# Patient Record
Sex: Male | Born: 1975 | Race: White | Hispanic: No | Marital: Single | Smoking: Never smoker
Health system: Southern US, Community
[De-identification: ages and names within clinical notes are randomized; demographics above are authoritative.]

---

## 2006-04-21 ENCOUNTER — Emergency Department: Payer: Self-pay | Admitting: Emergency Medicine

## 2017-11-27 DIAGNOSIS — R7989 Other specified abnormal findings of blood chemistry: Secondary | ICD-10-CM | POA: Insufficient documentation

## 2019-06-17 ENCOUNTER — Other Ambulatory Visit: Payer: Self-pay

## 2019-06-17 DIAGNOSIS — Z20822 Contact with and (suspected) exposure to covid-19: Secondary | ICD-10-CM

## 2019-06-22 LAB — NOVEL CORONAVIRUS, NAA: SARS-CoV-2, NAA: NOT DETECTED

## 2019-06-24 ENCOUNTER — Other Ambulatory Visit: Payer: Self-pay | Admitting: Internal Medicine

## 2019-06-24 DIAGNOSIS — Z20822 Contact with and (suspected) exposure to covid-19: Secondary | ICD-10-CM

## 2019-06-28 LAB — NOVEL CORONAVIRUS, NAA: SARS-CoV-2, NAA: DETECTED — AB

## 2019-06-30 ENCOUNTER — Telehealth: Payer: Self-pay

## 2019-06-30 NOTE — Telephone Encounter (Signed)
Outgoing call to Patient. Inquired if Patient received Covid-19 testing results.  Patient states that he received results.  Provided care advice.  Encouraged to call with questions if needed.  Patient voiced understanding.

## 2020-05-29 ENCOUNTER — Ambulatory Visit: Payer: Self-pay | Admitting: Family Medicine

## 2020-05-29 ENCOUNTER — Encounter: Payer: Self-pay | Admitting: Family Medicine

## 2020-05-29 ENCOUNTER — Other Ambulatory Visit: Payer: Self-pay

## 2020-05-29 DIAGNOSIS — Z113 Encounter for screening for infections with a predominantly sexual mode of transmission: Secondary | ICD-10-CM

## 2020-05-29 DIAGNOSIS — N453 Epididymo-orchitis: Secondary | ICD-10-CM

## 2020-05-29 LAB — GRAM STAIN

## 2020-05-29 MED ORDER — DOXYCYCLINE HYCLATE 100 MG PO TABS: 100.0000 mg | ORAL_TABLET | Freq: Two times a day (BID) | ORAL | 0 refills | Status: AC

## 2020-05-29 MED ORDER — CEFTRIAXONE SODIUM 500 MG IJ SOLR
500.0000 mg | Freq: Once | INTRAMUSCULAR | Status: AC
  Administered 2020-05-29: 500 mg via INTRAMUSCULAR

## 2020-05-29 NOTE — Progress Notes (Signed)
[facility] Department STI clinic/screening visit  Subjective:  Douglas Terrell is a 44 y.o. male being seen today for  Chief Complaint  Patient presents with  . Exposure to STD     The patient reports they do have symptoms.   Patient has the following medical conditions:   Patient Active Problem List   Diagnosis Date Noted  . 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 Denies fever, discharge from penis or dysuria. Denies injury, bike riding, etc. States a condom broke recently, would like STI screening.   Pt last urinated 2 hrs ago.  See flowsheet for further details and programmatic requirements.   Last HIV test per pt/review of record: 2 yrs Last tetanus vaccine: 5 yrs  No components found for: HCV  The following portions of the patient's history were reviewed and updated as appropriate: allergies, current medications, past medical history, past social history, past surgical history and problem list.  Objective:  There were no vitals filed for this visit.   Physical Exam Constitutional:      Appearance: Normal appearance.  HENT:     Head: Normocephalic and atraumatic.     Comments: No nits or hair loss    Mouth/Throat:     Mouth: Mucous membranes are moist.     Pharynx: Oropharynx is clear. No oropharyngeal exudate or posterior oropharyngeal erythema.  Pulmonary:     Effort: Pulmonary effort is normal.  Abdominal:     General: Abdomen is flat.     Palpations: Abdomen is soft. There is no hepatomegaly or mass.     Tenderness: There is no abdominal tenderness.  Genitourinary:    Pubic Area: No rash or pubic lice.      Penis: Normal and uncircumcised.      Testes:        Right: Tenderness (very mild tenderness) and swelling (mild) present. Mass, testicular hydrocele or varicocele not present. Right testis is descended. Cremasteric reflex is present.         Left:  Mass, tenderness, swelling, testicular hydrocele or varicocele not present. Left testis is descended. Cremasteric reflex is present.      Epididymis:     Right: Normal.     Left: Normal.     Rectum: Normal.  Lymphadenopathy:     Head:     Right side of head: No preauricular or posterior auricular adenopathy.     Left side of head: No preauricular or posterior auricular adenopathy.     Cervical: No cervical adenopathy.     Upper Body:     Right upper body: No supraclavicular or axillary adenopathy.     Left upper body: No supraclavicular or axillary adenopathy.     Lower Body: No right inguinal adenopathy. No left inguinal adenopathy.  Skin:    General: Skin is warm and dry.     Findings: No rash.  Neurological:     Mental Status: He is alert and oriented to person, place, and time.       Assessment and Plan:  Douglas Terrell is a 44 y.o. male presenting to the [facility] Department for STI screening   1. Screening examination for venereal disease -Pt with symptoms. Screenings today as below.  -Patient does not meet criteria for HepB, HepC Screening.  -Counseled on warning s/sx and when to seek care. Recommended condom use with all  sex and discussed importance of condom use for STI prevention. - Gram stain - Gonococcus culture - HIV [place] [facility] - Syphilis Serology, [place] [facility] - Gonococcus ulture  2. Epididymo-orchitis -As pt has risk factors for STI exposure will treat to cover GC and CT. Advised if symptoms persist after treatment to see PCP for further investigation.  -Pt counseled regarding medication. No known allergies to these medications.  -Advised no sex for 7 days after both pt and partner completes treatment and encouraged condoms with all sex. - cefTRIAXone (ROCEPHIN) injection 500 mg - doxycycline (VIBRA-TABS) 100 MG tablet; Take 1 tablet (100 mg total) by mouth 2 (two) times daily for 7 days.  Dispense: 14 tablet; Refill: 0   Return for  screening as needed.  No future appointments.  Ann Held, PA-C

## 2020-05-29 NOTE — Progress Notes (Signed)
Patient treated per provider orders. Gram stain reviewed with provider.Burt Knack, RN

## 2020-05-29 NOTE — Progress Notes (Signed)
Patient here for STD testing.Douglas Iodice Brewer-Jensen, RN 

## 2020-06-03 LAB — GONOCOCCUS CULTURE

## 2020-06-27 ENCOUNTER — Ambulatory Visit: Payer: Self-pay

## 2020-10-04 ENCOUNTER — Encounter: Payer: Self-pay | Admitting: Family Medicine

## 2020-10-04 ENCOUNTER — Other Ambulatory Visit: Payer: Self-pay

## 2020-10-04 ENCOUNTER — Ambulatory Visit: Payer: Self-pay | Admitting: Family Medicine

## 2020-10-04 DIAGNOSIS — Z113 Encounter for screening for infections with a predominantly sexual mode of transmission: Secondary | ICD-10-CM

## 2020-10-04 LAB — GRAM STAIN

## 2020-10-04 NOTE — Progress Notes (Signed)
Gram stain reviewed and is negative today, so no treatment needed for gram stain per standing order and per provider verbal order. Provider orders completed.

## 2020-10-04 NOTE — Progress Notes (Signed)
   [facility] Department STI clinic/screening visit  Subjective:  Douglas Terrell is a 44 y.o. male being seen today for an STI screening visit. The patient reports they do not have symptoms.    Patient has the following medical conditions:   Patient Active Problem List   Diagnosis Date Noted  . 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 SEXUALLY TRANSMITTED DISEASE    HPI  Patient reports he is here for an STD screen.  Denies symptoms.   See flowsheet for further details and programmatic requirements.    The following portions of the patient's history were reviewed and updated as appropriate: allergies, current medications, past medical history, past social history, past surgical history and problem list.  Objective:  There were no vitals filed for this visit.  Physical Exam Constitutional:      Appearance: Normal appearance.  HENT:     Head: Normocephalic and atraumatic.     Comments: No nits or hair loss    Mouth/Throat:     Mouth: Mucous membranes are moist.     Pharynx: Oropharynx is clear. No oropharyngeal exudate or posterior oropharyngeal erythema.  Pulmonary:     Effort: Pulmonary effort is normal.  Abdominal:     General: Abdomen is flat.     Palpations: Abdomen is soft. There is no hepatomegaly or mass.     Tenderness: There is no abdominal tenderness.  Genitourinary:    Pubic Area: No rash or pubic lice.      Penis: Normal.      Testes: Normal.     Epididymis:     Right: Normal.     Left: Normal.  Lymphadenopathy:     Head:     Right side of head: No preauricular or posterior auricular adenopathy.     Left side of head: No preauricular or posterior auricular adenopathy.     Cervical: No cervical adenopathy.     Upper Body:     Right upper body: No supraclavicular or axillary adenopathy.     Left upper body: No supraclavicular or axillary adenopathy.     Lower Body: No right inguinal adenopathy. No left  inguinal adenopathy.  Skin:    General: Skin is warm and dry.     Findings: No rash.  Neurological:     Mental Status: He is alert and oriented to person, place, and time.    Assessment and Plan:  Douglas Terrell is a 44 y.o. male presenting to the [facility] Department for STI screening  1. Screening examination for venereal disease - Gram stain - HIV [place] [facility] - Syphilis Serology, [place] [facility] - Gonococcus culture - No treatment today.   Co that client would be contacted if other testing have positive results Co to always use condoms foe STD prevention.   wReturn if symptoms worsen or fail to improve, for PRN.  No future appointments.  Larene Pickett, FNP

## 2020-10-10 LAB — GONOCOCCUS CULTURE

## 2021-01-29 ENCOUNTER — Other Ambulatory Visit: Payer: Self-pay

## 2021-01-29 ENCOUNTER — Emergency Department: Payer: No Typology Code available for payment source

## 2021-01-29 ENCOUNTER — Emergency Department
Admission: EM | Admit: 2021-01-29 | Discharge: 2021-01-29 | Disposition: A | Discharge status: Home / Self Care | Payer: No Typology Code available for payment source | Attending: Emergency Medicine | Admitting: Emergency Medicine

## 2021-01-29 DIAGNOSIS — S93111A Dislocation of interphalangeal joint of right great toe, initial encounter: Secondary | ICD-10-CM | POA: Diagnosis not present

## 2021-01-29 DIAGNOSIS — Y990 Civilian activity done for income or pay: Secondary | ICD-10-CM | POA: Diagnosis not present

## 2021-01-29 DIAGNOSIS — S93104A Unspecified dislocation of right toe(s), initial encounter: Secondary | ICD-10-CM

## 2021-01-29 DIAGNOSIS — W208XXA Other cause of strike by thrown, projected or falling object, initial encounter: Secondary | ICD-10-CM | POA: Diagnosis not present

## 2021-01-29 DIAGNOSIS — T1490XA Injury, unspecified, initial encounter: Secondary | ICD-10-CM

## 2021-01-29 DIAGNOSIS — S99921A Unspecified injury of right foot, initial encounter: Secondary | ICD-10-CM | POA: Diagnosis present

## 2021-01-29 MED ORDER — LIDOCAINE HCL (PF) 1 % IJ SOLN
10.0000 mL | Freq: Once | INTRAMUSCULAR | Status: AC
  Filled 2021-01-29: qty 10

## 2021-01-29 MED ORDER — LIDOCAINE HCL (PF) 1 % IJ SOLN
INTRAMUSCULAR | Status: AC
  Administered 2021-01-29: 10 mL
  Filled 2021-01-29: qty 5

## 2021-01-29 NOTE — ED Notes (Signed)
ED Provider at bedside. 

## 2021-01-29 NOTE — ED Triage Notes (Signed)
Pt hyperextended toe at work when palate fell onto toe. Pt co pain to right great toe.

## 2021-01-29 NOTE — ED Notes (Signed)
Discharge intructions reviewed with pt, pt calm , collective .

## 2021-01-29 NOTE — ED Provider Notes (Signed)
[facility] Emergency Department Provider Note  ____________________________________________  Time seen: Approximately 8:23 PM  I have reviewed the triage vital signs and the nursing notes.   HISTORY  Chief Complaint Toe Injury    HPI Douglas Terrell is a 45 y.o. male who presents the emergency department complaining of possible broken versus dislocated great toe of the right foot.  Patient had a machine hyperextended the toe earlier today.  Patient is having significant pain to the digit currently.  No history of previous fractures or injuries.         No past medical history on file.  Patient Active Problem List   Diagnosis Date Noted  . 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  Prior to Admission medications   Not on File    Allergies Patient has no known allergies.  No family history on file.  Social History Social History   Tobacco Use  . Smoking status: Never Smoker  . Smokeless tobacco: Never Used  Vaping Use  . Vaping Use: Former  Substance Use Topics  . Alcohol use: Yes    Comment: 2x/mo  . Drug use: Never     Review of Systems  Constitutional: No fever/chills Eyes: No visual changes. No discharge ENT: No upper respiratory complaints. Cardiovascular: no chest pain. Respiratory: no cough. No SOB. Gastrointestinal: No abdominal pain.  No nausea, no vomiting. Musculoskeletal: Positive for great toe injury Skin: Negative for rash, abrasions, lacerations, ecchymosis. Neurological: Negative for headaches, focal weakness or numbness.  10 System ROS otherwise negative.  ____________________________________________   PHYSICAL EXAM:  VITAL SIGNS: ED Triage Vitals  Enc Vitals Group     BP 01/29/21 1927 (!) 144/99     Pulse Rate 01/29/21 1927 61     Resp 01/29/21 1927 20     Temp 01/29/21 1927 98.5 F (36.9 C)     Temp Source 01/29/21 1927 Oral     SpO2 01/29/21 1927 96 %     Weight 01/29/21  1928 250 lb (113.4 kg)     Height 01/29/21 1928 6\' 1"  (1.854 m)     Head Circumference --      Peak Flow --      Pain Score 01/29/21 1928 4     Pain Loc --      Pain Edu? --      Excl. in GC? --      Constitutional: Alert and oriented. Well appearing and in no acute distress. Eyes: Conjunctivae are normal. PERRL. EOMI. Head: Atraumatic. ENT:      Ears:       Nose: No congestion/rhinnorhea.      Mouth/Throat: Mucous membranes are moist.  Neck: No stridor.    Cardiovascular: Normal rate, regular rhythm. Normal S1 and S2.  Good peripheral circulation. Respiratory: Normal respiratory effort without tachypnea or retractions. Lungs CTAB. Good air entry to the bases with no decreased or absent breath sounds. Musculoskeletal: Full range of motion to all extremities. No gross deformities appreciated.  Visualization of the great toe right foot revealed shortening when compared to left but no obvious deformity.  Patient was unable to flex the digit on initial exam.  Palpation revealed palpable deformity concerning for dislocation versus fracture over the interphalangeal joint.  Sensation and capillary refill intact Neurologic:  Normal speech and language. No gross focal neurologic deficits are appreciated.  Skin:  Skin is warm, dry and intact. No rash noted. Psychiatric: Mood and affect are normal. Speech and  behavior are normal. Patient exhibits appropriate insight and judgement.   ____________________________________________   LABS (all labs ordered are listed, but only abnormal results are displayed)  Labs Reviewed - No data to display ____________________________________________  EKG   ____________________________________________  RADIOLOGY I personally viewed and evaluated these images as part of my medical decision making, as well as reviewing the written report by the radiologist.  ED Provider Interpretation: Interphalangeal joint dislocation of the first digit with interval  reduction on second imaging  DG Foot Complete Right  Result Date: 01/29/2021 CLINICAL DATA:  Hyper extended great toe EXAM: RIGHT FOOT COMPLETE - 3+ VIEW COMPARISON:  None. FINDINGS: Frontal, oblique, lateral views of the right foot are obtained. There is dorsal dislocation of the first interphalangeal joint. No evidence of fracture. Mild osteoarthritis of the first metatarsophalangeal joint. Soft tissues are unremarkable. IMPRESSION: 1. Dorsal dislocation first interphalangeal joint. Electronically Signed   By: Sharlet Salina M.D.   On: 01/29/2021 19:53   DG Toe Great Right  Result Date: 01/29/2021 CLINICAL DATA:  Postreduction film EXAM: RIGHT GREAT TOE COMPARISON:  January 29, 2021 FINDINGS: There is been interval reduction of the dislocation of the distal first phalanx. No definite osseous fracture seen. There is near anatomic alignment. Overlying soft tissue swelling is seen. IMPRESSION: Interval reduction of the distal phalanx dislocation in near anatomic alignment. Electronically Signed   By: Jonna Clark M.D.   On: 01/29/2021 21:54    ____________________________________________    PROCEDURES  Procedure(s) performed:    Reduction of dislocation  Date/Time: 01/29/2021 10:05 PM Performed by: Racheal Patches, PA-C Authorized by: Racheal Patches, PA-C  Consent: Verbal consent obtained. Consent given by: patient Patient understanding: patient states understanding of the procedure being performed Imaging studies: imaging studies available Patient identity confirmed: verbally with patient Time out: Immediately prior to procedure a "time out" was called to verify the correct patient, procedure, equipment, support staff and site/side marked as required. Local anesthesia used: yes Anesthesia: digital block  Anesthesia: Local anesthesia used: yes Local Anesthetic: lidocaine 1% without epinephrine Anesthetic total: 6 mL  Sedation: Patient sedated: no  Patient  tolerance: patient tolerated the procedure well with no immediate complications Comments: Initial attempt at reduction without anesthetic was performed.  Patient had difficulty tolerating the pain and first attempt at reduction was unsuccessful.  Digital block was performed to the great toe and using traction of the great toe with manipulation over the joint itself, toe was successfully reduced.  This was confirmed with repeat imaging.  Patient's great toe and second toe are buddy taped.  Postop shoe for ambulation.       Medications  lidocaine (PF) (XYLOCAINE) 1 % injection 10 mL (10 mLs Infiltration Given by Other 01/29/21 2125)     ____________________________________________   INITIAL IMPRESSION / ASSESSMENT AND PLAN / ED COURSE  Pertinent labs & imaging results that were available during my care of the patient were reviewed by me and considered in my medical decision making (see chart for details).  Review of the [place] CSRS was performed in accordance of the [facility] prior to dispensing any controlled drugs.           Patient's diagnosis is consistent with toe dislocation.  Patient presented to the emergency department after having a dislocation of the right great toe.  Initial attempt at reduction was unsuccessful and patient was in exquisite pain with the first reduction attempt.  Toe was anesthetized and successfully reduced here in the emergency department.  Toes were buddy taped and patient is given postop shoe for ambulation.  Follow-up podiatry if needed..  Patient is given ED precautions to return to the ED for any worsening or new symptoms.     ____________________________________________  FINAL CLINICAL IMPRESSION(S) / ED DIAGNOSES  Final diagnoses:  Dislocation of phalanx of right foot, initial encounter      NEW MEDICATIONS STARTED DURING THIS VISIT:  ED Discharge Orders    None          This chart was dictated using voice recognition software/Dragon.  Despite best efforts to proofread, errors can occur which can change the meaning. Any change was purely unintentional.    Racheal Patches, PA-C 01/29/21 2211    Concha Se, MD 01/30/21 1501

## 2021-03-21 ENCOUNTER — Ambulatory Visit: Payer: Self-pay

## 2021-07-26 ENCOUNTER — Ambulatory Visit: Payer: Self-pay | Admitting: Internal Medicine

## 2021-09-12 ENCOUNTER — Ambulatory Visit: Payer: Self-pay | Admitting: Internal Medicine

## 2022-01-22 ENCOUNTER — Ambulatory Visit: Payer: Self-pay | Admitting: Family Medicine

## 2022-01-22 ENCOUNTER — Encounter: Payer: Self-pay | Admitting: Family Medicine

## 2022-01-22 ENCOUNTER — Other Ambulatory Visit: Payer: Self-pay

## 2022-01-22 DIAGNOSIS — Z113 Encounter for screening for infections with a predominantly sexual mode of transmission: Secondary | ICD-10-CM

## 2022-01-22 DIAGNOSIS — N341 Nonspecific urethritis: Secondary | ICD-10-CM

## 2022-01-22 LAB — GRAM STAIN

## 2022-01-22 LAB — HM HIV SCREENING LAB: HM HIV Screening: NEGATIVE

## 2022-01-22 MED ORDER — DOXYCYCLINE HYCLATE 100 MG PO TABS: 100.0000 mg | ORAL_TABLET | Freq: Two times a day (BID) | ORAL | 0 refills | Status: AC

## 2022-01-22 NOTE — Addendum Note (Signed)
Addended by: Berdie Ogren on: 01/22/2022 05:23 PM   Modules accepted: Orders

## 2022-01-22 NOTE — Progress Notes (Signed)
[facility] Department STI clinic/screening visit  Subjective:  Douglas Terrell is a 46 y.o. male being seen today for an STI screening visit. The patient reports they do not have symptoms.    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 No  Screening for MPX risk: Does the patient have an unexplained rash? No Is the patient MSM? No Does the patient endorse multiple sex partners or anonymous sex partners? No Did the patient have close or sexual contact with a person diagnosed with MPX? No Has the patient traveled outside the [place] where MPX is endemic? No Is there a high clinical suspicion for MPX-- evidenced by one of the following No  -Unlikely to be chickenpox  -Lymphadenopathy  -Rash that present in same phase of evolution on any given body part   See flowsheet for further details and programmatic requirements.    The following portions of the patient's history were reviewed and updated as appropriate: allergies, current medications, past medical history, past social history, past surgical history and problem list.  Objective:  There were no vitals filed for this visit.  Physical Exam    Assessment and Plan:  Douglas Terrell is a 46 y.o. male presenting to the [facility] Department for STI screening  1. Screening examination for venereal disease Patient does not have STI symptoms Patient accepted all screenings including  gram stain, urethral CT/GC and bloodwork for HIV/RPR.  Patient meets criteria for HepB screening? No. Ordered? No - does not meet criteria  Patient meets criteria for HepC screening? No. Ordered? No - does not meet criteria   Recommended condom use with all sex Discussed importance of condom use for STI prevent  Treat gram stain per standing order Discussed time line for State Lab results and that patient will be called with positive results and encouraged patient to call if he had not heard in 2 weeks Recommended returning for continued or worsening symptoms.   - HIV [place] [facility] - Syphilis Serology, [place] [facility] - Gonococcus culture - Gram stain     No follow-ups on file.  No future appointments.  Wendi Snipes, FNP

## 2022-01-22 NOTE — Progress Notes (Signed)
Pt here for STD screening.  Gram stain results reviewed and medication dispensed, per SO.  Condoms given.  Berdie Ogren, RN

## 2022-01-27 LAB — GONOCOCCUS CULTURE

## 2022-03-25 ENCOUNTER — Encounter: Payer: Self-pay | Admitting: Family Medicine

## 2022-03-25 ENCOUNTER — Ambulatory Visit: Payer: Self-pay | Admitting: Family Medicine

## 2022-03-25 DIAGNOSIS — Z113 Encounter for screening for infections with a predominantly sexual mode of transmission: Secondary | ICD-10-CM

## 2022-03-25 LAB — GRAM STAIN

## 2022-03-25 LAB — HM HIV SCREENING LAB: HM HIV Screening: NEGATIVE

## 2022-03-25 NOTE — Progress Notes (Signed)
Patient here for STD screening. Condoms declined, ?

## 2022-03-25 NOTE — Progress Notes (Signed)
[facility] Department ?STI clinic/screening visit ? ?Subjective:  ?Douglas Terrell is a 46 y.o. male being seen today for an STI screening visit. The patient reports they do not have symptoms.   ? ?Patient has the following medical conditions:   ?Patient Active Problem List  ? Diagnosis Date Noted  ? 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 ? ? ?Chief Complaint  ?Patient presents with  ? SEXUALLY TRANSMITTED DISEASE  ? ? ?HPI ? ?Patient reports here for screening, denies s/sx  ? ?Does the patient or their partner desires a pregnancy in the next year? No ? ?Screening for MPX risk: ?Does the patient have Noan unexplained rash? No ?Is the patient MSM?  ?Does the patient endorse multiple sex partners or anonymous sex partners? No ?Did the patient have close or sexual contact with a person diagnosed with MPX? No ?Has the patient traveled outside the [place] where MPX is endemic? No ?Is there a high clinical suspicion for MPX-- evidenced by one of the following No ? -Unlikely to be chickenpox ? -Lymphadenopathy ? -Rash that present in same phase of evolution on any given body part ? ? ?See flowsheet for further details and programmatic requirements.  ? ? ?The following portions of the patient's history were reviewed and updated as appropriate: allergies, current medications, past medical history, past social history, past surgical history and problem list. ? ?Objective:  ?There were no vitals filed for this visit. ? ?Physical Exam ? ? ? ?Assessment and Plan:  ?Douglas Terrell is a 46 y.o. male presenting to the [facility] Department for STI screening ? ?1. Screening examination for venereal disease ?Patient does not have STI symptoms ?Patient accepted all screenings including gram stain, urethral GC and bloodwork for HIV/RPR.  ?Patient meets criteria for HepB screening? No. Ordered? No -   ?Patient meets criteria for HepC screening? No. Ordered? No  ?Recommended condom use with all sex ?Discussed importance of  condom use for STI prevent ? ?Treat gram stain per standing order ?Discussed time line for State Lab results and that patient will be called with positive results and encouraged patient to call if he had not heard in 2 weeks ?Recommended returning for continued or worsening symptoms.   ?- HIV [place] [facility] ?- Syphilis Serology, [place] [facility] ?- Gonococcus culture ?- Gram stain ? ? ? ? ?No follow-ups on file. ? ?No future appointments. ? ?Wendi Snipes, FNP ? ?

## 2022-03-26 ENCOUNTER — Ambulatory Visit: Payer: Self-pay

## 2022-03-30 LAB — GONOCOCCUS CULTURE

## 2022-06-17 ENCOUNTER — Encounter: Payer: Self-pay | Admitting: Nurse Practitioner

## 2022-06-17 ENCOUNTER — Ambulatory Visit: Payer: Self-pay | Admitting: Nurse Practitioner

## 2022-06-17 DIAGNOSIS — Z113 Encounter for screening for infections with a predominantly sexual mode of transmission: Secondary | ICD-10-CM

## 2022-06-17 NOTE — Progress Notes (Signed)
Pt here for STD screening.  Condoms declined.  Daisey Caloca M Jerico Grisso, RN ° °

## 2022-06-17 NOTE — Progress Notes (Signed)
[facility] Department STI clinic/screening visit  Subjective:  Shriyan Arakawa is a 46 y.o. male being seen today for an STI screening visit. The patient reports they do not have symptoms.    Patient has the following medical conditions:   Patient Active Problem List   Diagnosis Date Noted   Low testosterone 11/27/2017     Chief Complaint  Patient presents with   SEXUALLY TRANSMITTED DISEASE    Screen-no symptoms    HPI  Patient reports to clinic today for STD screening.  Patient reports being asymptomatic.    Does the patient or their partner desires a pregnancy in the next year? No  Screening for MPX risk: Does the patient have an unexplained rash? No Is the patient MSM? No Does the patient endorse multiple sex partners or anonymous sex partners? No Did the patient have close or sexual contact with a person diagnosed with MPX? No Has the patient traveled outside the [place] where MPX is endemic? No Is there a high clinical suspicion for MPX-- evidenced by one of the following No  -Unlikely to be chickenpox  -Lymphadenopathy  -Rash that present in same phase of evolution on any given body part   See flowsheet for further details and programmatic requirements.   Immunization History  Administered Date(s) Administered   Hep A / Hep B 03/11/2006   Tdap 03/11/2006     The following portions of the patient's history were reviewed and updated as appropriate: allergies, current medications, past medical history, past social history, past surgical history and problem list.  Objective:  There were no vitals filed for this visit.  Physical Exam Constitutional:      Appearance: Normal appearance.  HENT:     Head: Normocephalic. No abrasion, masses or laceration. Hair is normal.     Mouth/Throat:     Mouth: No oral lesions.     Dentition: No dental caries.     Pharynx: No pharyngeal swelling, oropharyngeal exudate, posterior oropharyngeal erythema or uvula  swelling.     Tonsils: No tonsillar exudate or tonsillar abscesses.  Eyes:     General: Lids are normal.        Right eye: No discharge.        Left eye: No discharge.     Conjunctiva/sclera: Conjunctivae normal.     Right eye: No exudate.    Left eye: No exudate. Abdominal:     General: Abdomen is flat.     Palpations: Abdomen is soft.     Tenderness: There is no abdominal tenderness. There is no rebound.  Genitourinary:    Pubic Area: No rash or pubic lice.      Penis: Normal and circumcised. No erythema or discharge.      Testes: Normal.        Right: Mass or tenderness not present.        Left: Mass or tenderness not present.     Rectum: Normal.     Comments: Discharge amount: None Color: None  Musculoskeletal:     Cervical back: Full passive range of motion without pain, normal range of motion and neck supple.  Lymphadenopathy:     Cervical: No cervical adenopathy.     Right cervical: No superficial, deep or posterior cervical adenopathy.    Left cervical: No superficial, deep or posterior cervical adenopathy.     Upper Body:     Right upper body: No supraclavicular, axillary or epitrochlear adenopathy.     Left upper body: No supraclavicular,  axillary or epitrochlear adenopathy.     Lower Body: No right inguinal adenopathy. No left inguinal adenopathy.  Skin:    General: Skin is warm and dry.     Findings: No lesion or rash.  Neurological:     Mental Status: He is alert and oriented to person, place, and time.  Psychiatric:        Attention and Perception: Attention normal.        Mood and Affect: Mood normal.        Speech: Speech normal.        Behavior: Behavior normal. Behavior is cooperative.       Assessment and Plan:  Reubin Bushnell is a 46 y.o. male presenting to the [facility] Department for STI screening  1. Screening examination for venereal disease -46 year old male in clinic for STD screening. -Patient does not have STI  symptoms Patient accepted all screenings including  oral GC and urine CT/GC and bloodwork for HIV/RPR.  Patient meets criteria for HepB screening? No. Ordered? No - low risk Patient meets criteria for HepC screening? No. Ordered? No - low risk  Recommended condom use with all sex Discussed importance of condom use for STI prevent   Discussed time line for State Lab results and that patient will be called with positive results and encouraged patient to call if he had not heard in 2 weeks Recommended returning for continued or worsening symptoms.    - HIV [place] [facility] - Syphilis Serology, [place] [facility] - Gonococcus culture - Chlamydia/GC NAA, Confirmation     Return if symptoms worsen or fail to improve.    Glenna Fellows, FNP

## 2022-06-20 LAB — CHLAMYDIA/GC NAA, CONFIRMATION
Chlamydia trachomatis, NAA: NEGATIVE
Neisseria gonorrhoeae, NAA: NEGATIVE

## 2022-06-22 LAB — GONOCOCCUS CULTURE

## 2022-08-13 ENCOUNTER — Ambulatory Visit
Admission: EM | Admit: 2022-08-13 | Discharge: 2022-08-13 | Disposition: A | Discharge status: Home / Self Care | Payer: Managed Care, Other (non HMO) | Attending: Emergency Medicine | Admitting: Emergency Medicine

## 2022-08-13 DIAGNOSIS — Z202 Contact with and (suspected) exposure to infections with a predominantly sexual mode of transmission: Secondary | ICD-10-CM | POA: Diagnosis present

## 2022-08-13 DIAGNOSIS — R369 Urethral discharge, unspecified: Secondary | ICD-10-CM

## 2022-08-13 DIAGNOSIS — Z113 Encounter for screening for infections with a predominantly sexual mode of transmission: Secondary | ICD-10-CM | POA: Diagnosis not present

## 2022-08-13 NOTE — ED Provider Notes (Signed)
[facility]    CSN: [id number] Arrival date & time: 08/13/22  1553    HISTORY   Chief Complaint  Patient presents with   Exposure to STD    Entered by patient   HPI Douglas Terrell is a pleasant, 46 y.o. male who presents to urgent care today. Patient complains of 1 day history of penile discharge.  Patient states he does not have any burning with urination, perineal pain, genital lesions, testicular or scrotal swelling or pain.  Patient states he was exposed to an STD but does not know which one, states last sexual contact with that partner was 6 days ago, patient was unwilling to provide further information.  Patient is also requesting screening for HIV and syphilis.  The history is provided by the patient.   History reviewed. No pertinent past medical history. Patient Active Problem List   Diagnosis Date Noted   Low testosterone 11/27/2017   History reviewed. No pertinent surgical history.  Home Medications    Prior to Admission medications   Medication Sig Start Date End Date Taking? Authorizing Provider  levothyroxine (SYNTHROID) 100 MCG tablet Take 90 mcg by mouth daily before breakfast.    [provider]  testosterone cypionate (DEPOTESTOTERONE CYPIONATE) 100 MG/ML injection Inject 200 mg into the muscle daily. For IM use only    [provider]    Family History History reviewed. No pertinent family history. Social History Social History   Tobacco Use   Smoking status: Never   Smokeless tobacco: Never  Vaping Use   Vaping Use: Never used  Substance Use Topics   Alcohol use: Yes    Alcohol/week: 2.0 standard drinks of alcohol    Types: 2 Cans of beer per week    Comment: monthly   Drug use: Never   Allergies   Other  Review of Systems Review of Systems Pertinent findings revealed after performing a 14 point review of systems has been noted in the history of present illness.  Physical Exam Triage Vital Signs ED Triage  Vitals  Enc Vitals Group     BP 09/28/21 0827 (!) 147/82     Pulse Rate 09/28/21 0827 72     Resp 09/28/21 0827 18     Temp 09/28/21 0827 98.3 F (36.8 C)     Temp Source 09/28/21 0827 Oral     SpO2 09/28/21 0827 98 %     Weight --      Height --      Head Circumference --      Peak Flow --      Pain Score 09/28/21 0826 5     Pain Loc --      Pain Edu? --      Excl. in GC? --   No data found.  Updated Vital Signs BP 134/89 (BP Location: Right Arm)   Pulse 89   Temp 98.4 F (36.9 C) (Oral)   Resp 18   SpO2 95%   Physical Exam Vitals and nursing note reviewed.  Constitutional:      General: He is not in acute distress.    Appearance: Normal appearance. He is not ill-appearing.  HENT:     Head: Normocephalic and atraumatic.  Eyes:     General: Lids are normal.        Right eye: No discharge.        Left eye: No discharge.     Extraocular Movements: Extraocular movements intact.     Conjunctiva/sclera: Conjunctivae normal.  Right eye: Right conjunctiva is not injected.     Left eye: Left conjunctiva is not injected.  Neck:     Trachea: Trachea and phonation normal.  Cardiovascular:     Rate and Rhythm: Normal rate and regular rhythm.     Pulses: Normal pulses.     Heart sounds: Normal heart sounds. No murmur heard.    No friction rub. No gallop.  Pulmonary:     Effort: Pulmonary effort is normal. No accessory muscle usage, prolonged expiration or respiratory distress.     Breath sounds: Normal breath sounds. No stridor, decreased air movement or transmitted upper airway sounds. No decreased breath sounds, wheezing, rhonchi or rales.  Chest:     Chest wall: No tenderness.  Genitourinary:    Comments: Pt politely declines GU exam, pt did provide a penile swab for testing.   Musculoskeletal:        General: Normal range of motion.     Cervical back: Normal range of motion and neck supple. Normal range of motion.  Lymphadenopathy:     Cervical: No cervical  adenopathy.  Skin:    General: Skin is warm and dry.     Findings: No erythema or rash.  Neurological:     General: No focal deficit present.     Mental Status: He is alert and oriented to person, place, and time.  Psychiatric:        Mood and Affect: Mood normal.        Behavior: Behavior normal.     Visual Acuity Right Eye Distance:   Left Eye Distance:   Bilateral Distance:    Right Eye Near:   Left Eye Near:    Bilateral Near:     [facility] / Diagnostics / Procedures:     Radiology No results found.  Procedures Procedures (including critical care time) EKG  Pending results:  Labs Reviewed  RPR  HIV ANTIBODY (ROUTINE TESTING W REFLEX)  CYTOLOGY, (ORAL, ANAL, URETHRAL) ANCILLARY ONLY    Medications Ordered in UC: Medications - No data to display  UC Diagnoses / Final Clinical Impressions(s)   I have reviewed the triage vital signs and the nursing notes.  Pertinent labs & imaging results that were available during my care of the patient were reviewed by me and considered in my medical decision making (see chart for details).    Final diagnoses:  Screening examination for STD (sexually transmitted disease)  Possible exposure to STD  Abnormal penile discharge    STD screening was performed, patient advised that the results be posted to their MyChart and if any of the results are positive, they will be notified by phone, further treatment will be provided as indicated based on results of STD screening. Patient was advised to abstain from sexual intercourse until that they receive the results of their STD testing.  Patient was also advised to use condoms to protect themselves from STD exposure. Return precautions advised.  Drug allergies reviewed, all questions addressed.     ED Prescriptions   None    PDMP not reviewed this encounter.  Disposition Upon Discharge:  Condition: stable for discharge home  Patient presented with concern for an acute illness  with associated systemic symptoms and significant discomfort requiring urgent management. In my opinion, this is a condition that a prudent lay person (someone who possesses an average knowledge of health and medicine) may potentially expect to result in complications if not addressed urgently such as respiratory distress, impairment of bodily  function or dysfunction of bodily organs.   As such, the patient has been evaluated and assessed, work-up was performed and treatment was provided in alignment with urgent care protocols and evidence based medicine.  Patient/parent/caregiver has been advised that the patient may require follow up for further testing and/or treatment if the symptoms continue in spite of treatment, as clinically indicated and appropriate.  Routine symptom specific, illness specific and/or disease specific instructions were discussed with the patient and/or caregiver at length.  Prevention strategies for avoiding STD exposure were also discussed.  The patient will follow up with their current PCP if and as advised. If the patient does not currently have a PCP we will assist them in obtaining one.   The patient may need specialty follow up if the symptoms continue, in spite of conservative treatment and management, for further workup, evaluation, consultation and treatment as clinically indicated and appropriate.  Patient/parent/caregiver verbalized understanding and agreement of plan as discussed.  All questions were addressed during visit.  Please see discharge instructions below for further details of plan.  Discharge Instructions:   Discharge Instructions      The results of your STD testing today which screens for HIV, syphilis, gonorrhea, chlamydia, and trichomonas will be made available to you once it is complete.  This typically takes 3 to 5 days.  Please note that we do not test for herpes virus unless you are having an active lesion concerning for herpes outbreak.   Please abstain from sexual intercourse of any kind, vaginal, oral or anal, until you have received the results of your STD testing.     Please remember that the only way to prevent transmission of sexually transmitted disease when having sexual intercourse is to use condoms.  Repeat sexually transmitted infections can cause scarring of the tubes that carry sperm from your testicles to your penis during ejaculation.  This can interfere with your your ability to have children.  Repeat exposures to sexually transmitted diseases can also increase your risk of contracting HIV as well as HPV, human papilloma virus, which causes genital warts.   If you have not had complete resolution of your symptoms after completing treatment, if any is needed, please return for repeat evaluation.   Thank you for visiting urgent care today.  I appreciate the opportunity to participate in your care.       This office note has been dictated using Teaching laboratory technician.  Unfortunately, this method of dictation can sometimes lead to typographical or grammatical errors.  I apologize for your inconvenience in advance if this occurs.  Please do not hesitate to reach out to me if clarification is needed.       Theadora Rama Scales, PA-C 08/13/22 1720

## 2022-08-13 NOTE — Discharge Instructions (Addendum)
The results of your STD testing today which screens for HIV, syphilis, gonorrhea, chlamydia, and trichomonas will be made available to you once it is complete.  This typically takes 3 to 5 days.  Please note that we do not test for herpes virus unless you are having an active lesion concerning for herpes outbreak.  Please abstain from sexual intercourse of any kind, vaginal, oral or anal, until you have received the results of your STD testing.     Please remember that the only way to prevent transmission of sexually transmitted disease when having sexual intercourse is to use condoms.  Repeat sexually transmitted infections can cause scarring of the tubes that carry sperm from your testicles to your penis during ejaculation.  This can interfere with your your ability to have children.  Repeat exposures to sexually transmitted diseases can also increase your risk of contracting HIV as well as HPV, human papilloma virus, which causes genital warts.   If you have not had complete resolution of your symptoms after completing treatment, if any is needed, please return for repeat evaluation.   Thank you for visiting urgent care today.  I appreciate the opportunity to participate in your care.

## 2022-08-13 NOTE — ED Notes (Signed)
Symptoms x 1 day, penile discharge after exposure to a STD  No pain    No harmful thoughts

## 2022-08-14 LAB — RPR: RPR Ser Ql: NONREACTIVE

## 2022-08-14 LAB — CYTOLOGY, (ORAL, ANAL, URETHRAL) ANCILLARY ONLY
Chlamydia: NEGATIVE
Comment: NEGATIVE
Comment: NEGATIVE
Comment: NORMAL
Neisseria Gonorrhea: POSITIVE — AB
Trichomonas: NEGATIVE

## 2022-08-14 LAB — HIV ANTIBODY (ROUTINE TESTING W REFLEX): HIV Screen 4th Generation wRfx: NONREACTIVE

## 2022-08-15 ENCOUNTER — Ambulatory Visit
Admission: EM | Admit: 2022-08-15 | Discharge: 2022-08-15 | Disposition: A | Discharge status: Home / Self Care | Payer: Managed Care, Other (non HMO) | Attending: Emergency Medicine | Admitting: Emergency Medicine

## 2022-08-15 ENCOUNTER — Telehealth (HOSPITAL_COMMUNITY): Payer: Self-pay | Admitting: Emergency Medicine

## 2022-08-15 DIAGNOSIS — A549 Gonococcal infection, unspecified: Secondary | ICD-10-CM

## 2022-08-15 MED ORDER — CEFTRIAXONE SODIUM 500 MG IJ SOLR
500.0000 mg | Freq: Once | INTRAMUSCULAR | Status: AC
  Administered 2022-08-15: 500 mg via INTRAMUSCULAR

## 2022-08-15 NOTE — Discharge Instructions (Signed)
Today you received a Rocephin injection for treatment of gonorrhea. Please abstain from sexual activity for at least 7 days.

## 2022-08-15 NOTE — Telephone Encounter (Signed)
Per protocol, patient will need treatment with IM Rocephin 500mg for positive Gonorrhea 

## 2022-08-15 NOTE — ED Triage Notes (Signed)
Pt presents to urgent care today for gonorrhea.

## 2022-09-16 IMAGING — DX DG FOOT COMPLETE 3+V*R*
3 series · 3 of 3 positions shown · non-contrast
Comparison: None.

CLINICAL DATA: Hyper extended great toe

EXAM:
RIGHT FOOT COMPLETE - 3+ VIEW

[foot ap]
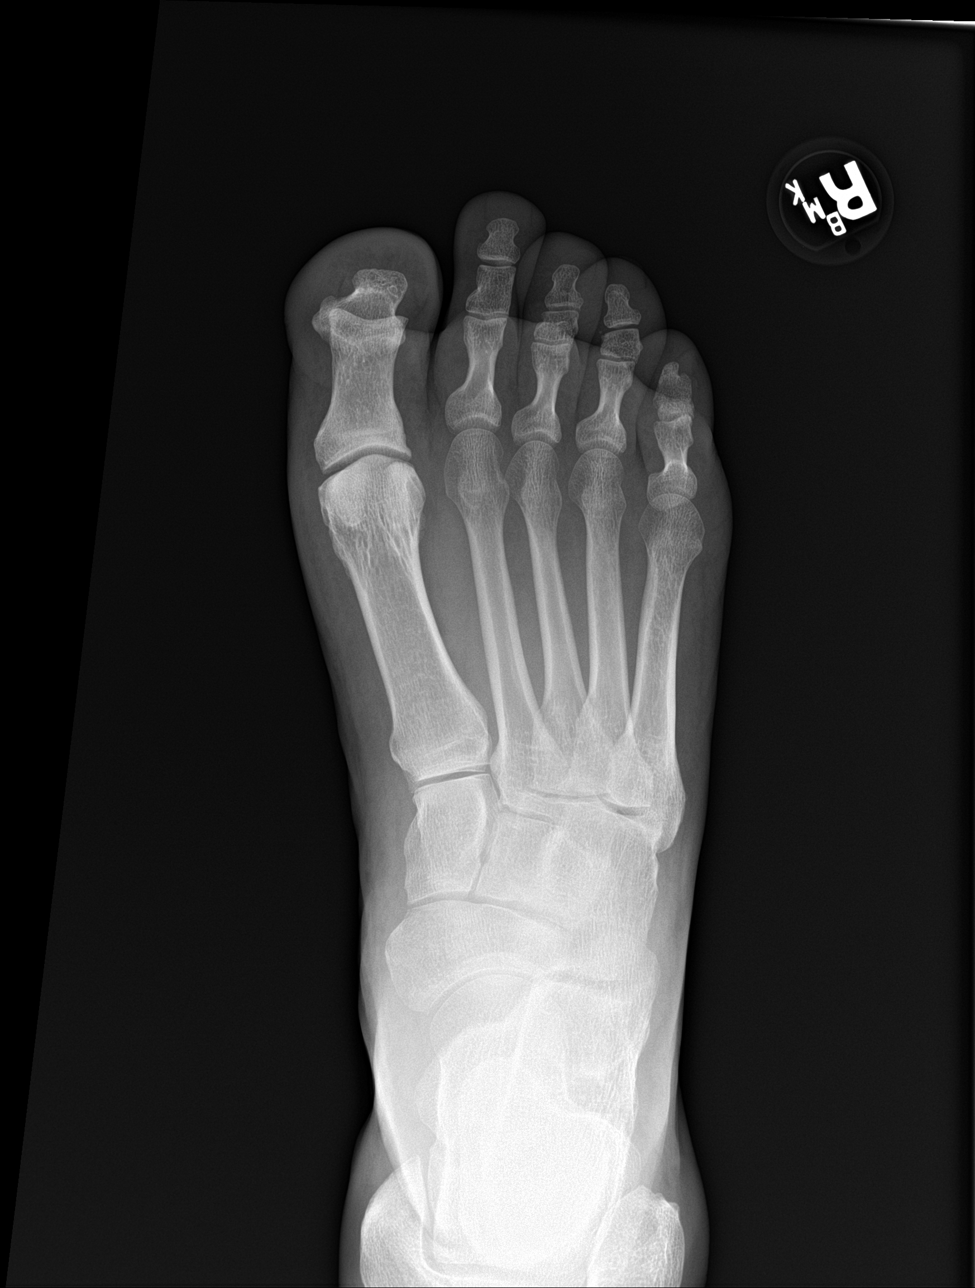

[foot obl]
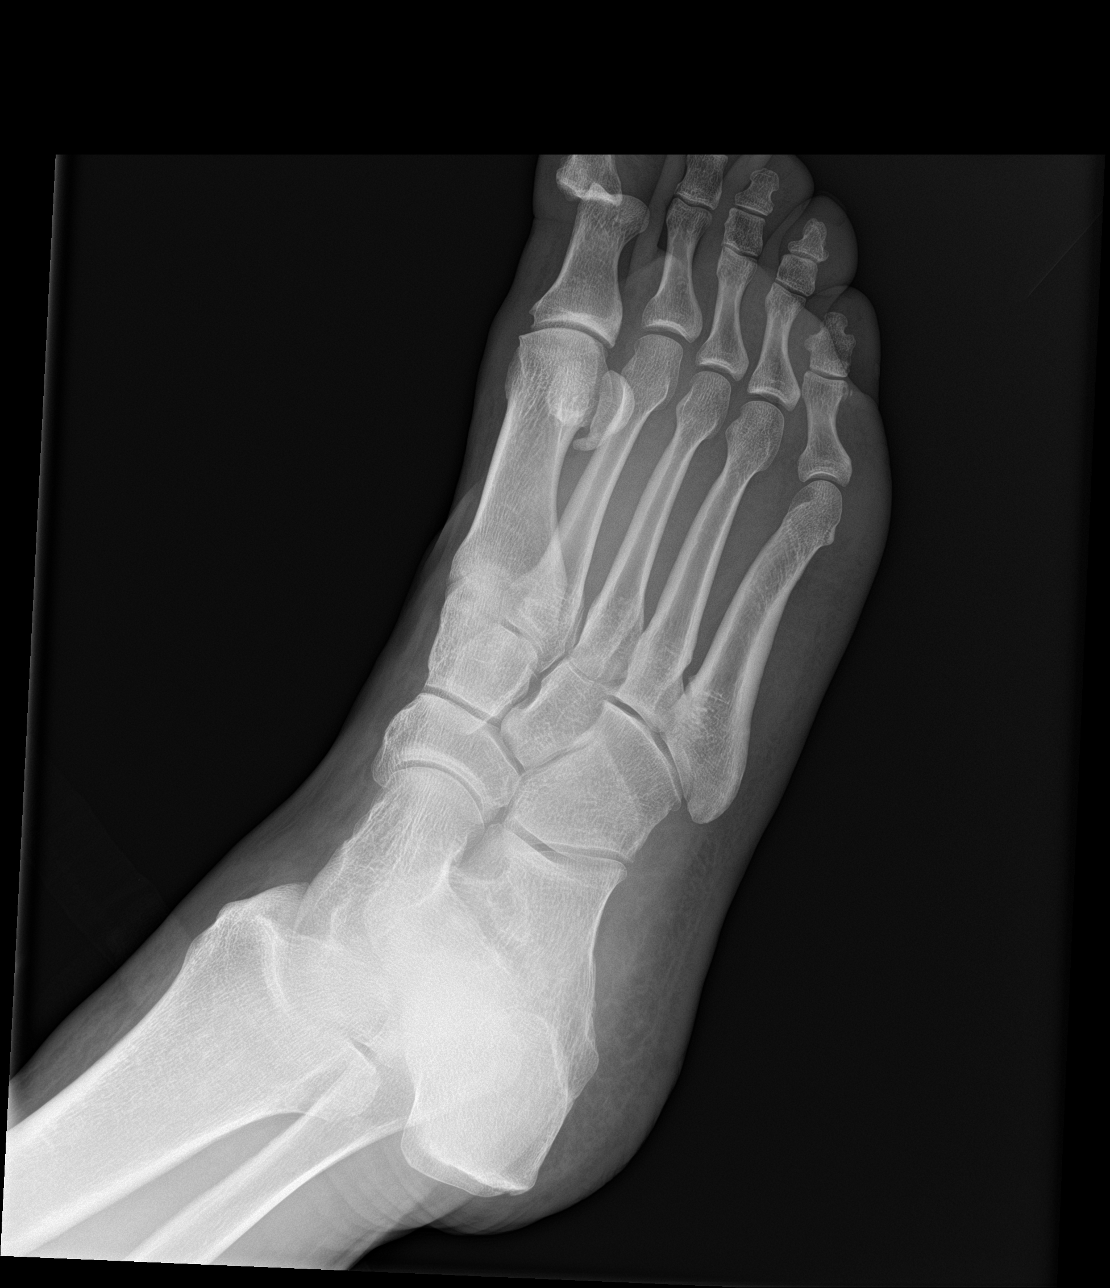

[foot lat]
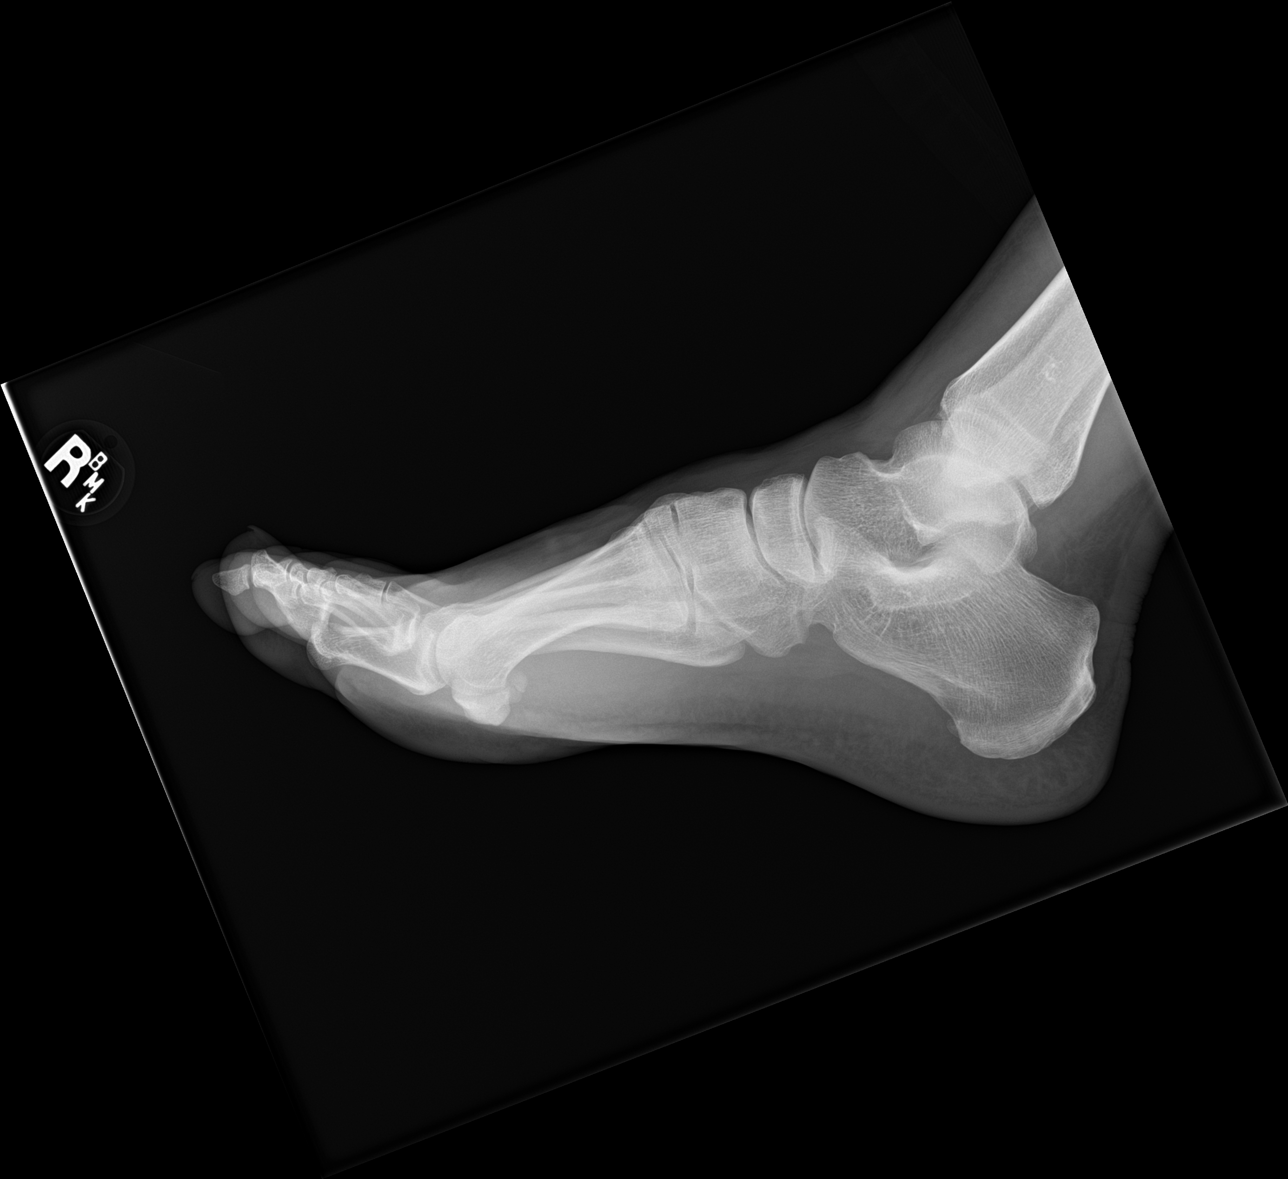

[3 of 3 positions shown; findings below may reference images not displayed]

FINDINGS: Frontal, oblique, lateral views of the right foot are obtained.
There is dorsal dislocation of the first interphalangeal joint. No
evidence of fracture.

Mild osteoarthritis of the first metatarsophalangeal joint. Soft
tissues are unremarkable.
IMPRESSION: 1. Dorsal dislocation first interphalangeal joint.

## 2022-09-16 IMAGING — DX DG TOE GREAT 2+V*R*
3 series · 3 of 3 positions shown · non-contrast
Comparison: January 29, 2021

CLINICAL DATA: Postreduction film

EXAM:
RIGHT GREAT TOE

[toe ap]
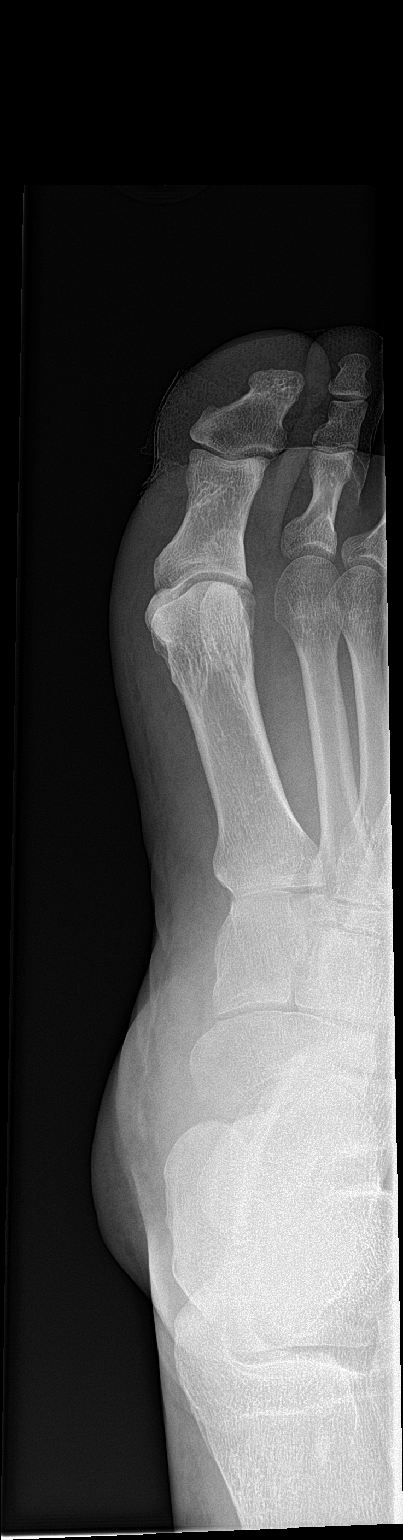

[toe obl]
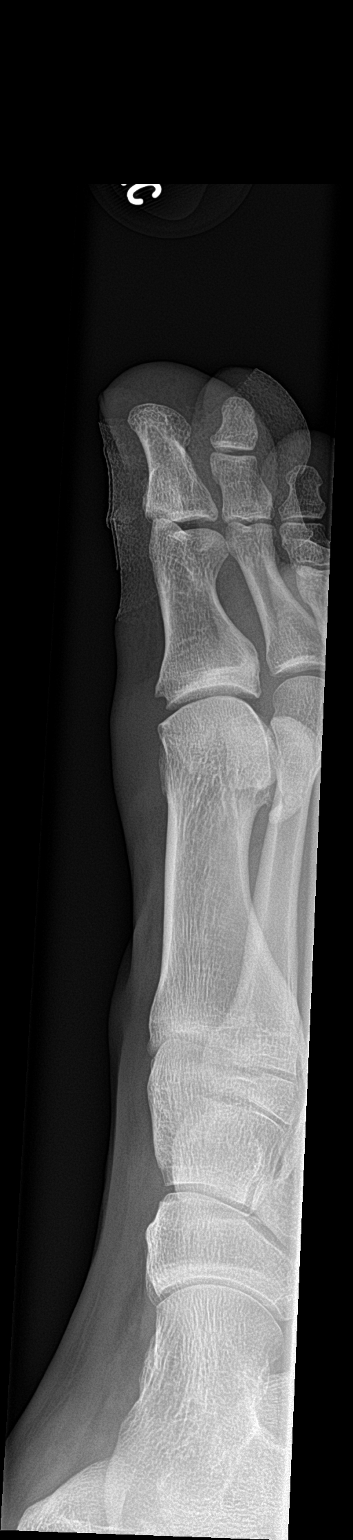

[toe lat]
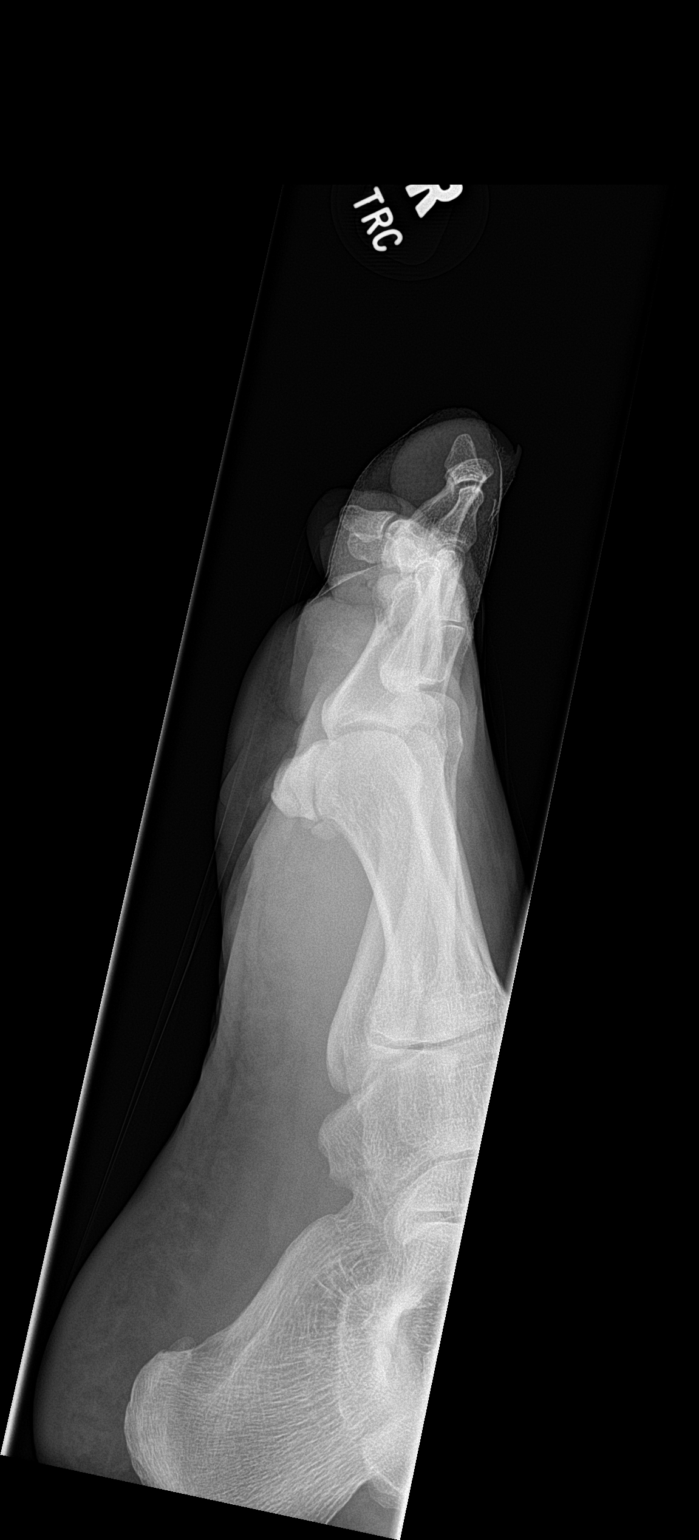

[3 of 3 positions shown; findings below may reference images not displayed]

FINDINGS: There is been interval reduction of the dislocation of the distal
first phalanx. No definite osseous fracture seen. There is near
anatomic alignment. Overlying soft tissue swelling is seen.
IMPRESSION: Interval reduction of the distal phalanx dislocation in near
anatomic alignment.

## 2022-10-16 ENCOUNTER — Encounter: Payer: Self-pay | Admitting: Nurse Practitioner

## 2022-10-16 ENCOUNTER — Ambulatory Visit: Payer: Self-pay | Admitting: Nurse Practitioner

## 2022-10-16 DIAGNOSIS — Z113 Encounter for screening for infections with a predominantly sexual mode of transmission: Secondary | ICD-10-CM

## 2022-10-16 LAB — HM HIV SCREENING LAB: HM HIV Screening: NEGATIVE

## 2022-10-16 NOTE — Progress Notes (Addendum)
[facility] Department STI clinic/screening visit  Subjective:  Douglas Terrell is a 46 y.o. male being seen today for an STI screening visit. The patient reports they do not have symptoms.    Patient has the following medical conditions:   Patient Active Problem List   Diagnosis Date Noted   Low testosterone 11/27/2017     Chief Complaint  Patient presents with   SEXUALLY TRANSMITTED DISEASE    Routine screening    HPI  Patient reports to clinic today for STD screening. Patient is asymptomatic.   Last HIV test per patient/review of record was  Lab Results  Component Value Date   HMHIVSCREEN Negative - Validated 03/25/2022    Lab Results  Component Value Date   HIV Non Reactive 08/13/2022    Does the patient or their partner desires a pregnancy in the next year? No  Screening for MPX risk: Does the patient have an unexplained rash? No Is the patient MSM? No Does the patient endorse multiple sex partners or anonymous sex partners? No Did the patient have close or sexual contact with a person diagnosed with MPX? No Has the patient traveled outside the [place] where MPX is endemic? No Is there a high clinical suspicion for MPX-- evidenced by one of the following No  -Unlikely to be chickenpox  -Lymphadenopathy  -Rash that present in same phase of evolution on any given body part   See flowsheet for further details and programmatic requirements.   Immunization History  Administered Date(s) Administered   Hep A / Hep B 03/11/2006   Tdap 03/11/2006     The following portions of the patient's history were reviewed and updated as appropriate: allergies, current medications, past medical history, past social history, past surgical history and problem list.  Objective:  There were no vitals filed for this visit.  Physical Exam Constitutional:      Appearance: Normal appearance.  HENT:     Head: Normocephalic. No abrasion, masses or laceration. Hair is  normal.     Right Ear: External ear normal.     Left Ear: External ear normal.     Nose: Nose normal.     Mouth/Throat:     Lips: Pink.     Mouth: Mucous membranes are moist. No oral lesions.     Pharynx: No pharyngeal swelling, oropharyngeal exudate, posterior oropharyngeal erythema or uvula swelling.     Tonsils: No tonsillar exudate or tonsillar abscesses.  Eyes:     General: Lids are normal.        Right eye: No discharge.        Left eye: No discharge.     Conjunctiva/sclera: Conjunctivae normal.     Right eye: No exudate.    Left eye: No exudate. Abdominal:     General: Abdomen is flat.     Palpations: Abdomen is soft.     Tenderness: There is no abdominal tenderness. There is no rebound.  Genitourinary:    Pubic Area: No rash or pubic lice.      Penis: Normal and circumcised. No erythema or discharge.      Testes: Normal.        Right: Mass or tenderness not present.        Left: Mass or tenderness not present.     Rectum: Normal.     Comments: Discharge amount: none Color: none  Musculoskeletal:     Cervical back: Full passive range of motion without pain, normal range of motion and neck  supple.  Lymphadenopathy:     Cervical: No cervical adenopathy.     Right cervical: No superficial, deep or posterior cervical adenopathy.    Left cervical: No superficial, deep or posterior cervical adenopathy.     Upper Body:     Right upper body: No supraclavicular, axillary or epitrochlear adenopathy.     Left upper body: No supraclavicular, axillary or epitrochlear adenopathy.     Lower Body: No right inguinal adenopathy. No left inguinal adenopathy.  Skin:    General: Skin is warm and dry.     Findings: No lesion or rash.  Neurological:     Mental Status: He is alert and oriented to person, place, and time.  Psychiatric:        Attention and Perception: Attention normal.        Mood and Affect: Mood normal.        Speech: Speech normal.        Behavior: Behavior normal.  Behavior is cooperative.       Assessment and Plan:  Douglas Terrell is a 46 y.o. male presenting to the [facility] Department for STI screening  1. Screening examination for venereal disease -46 year old male in clinic for STD screening. -Patient does not have STI symptoms Patient accepted all screenings including oral GC, urine CT/GC and bloodwork for HIV/RPR.  Patient meets criteria for HepB screening? No. Ordered? No - low risk Patient meets criteria for HepC screening? No. Ordered? No - low risk Recommended condom use with all sex Discussed importance of condom use for STI prevent   Discussed time line for State Lab results and that patient will be called with positive results and encouraged patient to call if he had not heard in 2 weeks Recommended returning for continued or worsening symptoms.     - HIV [place] [facility] - Syphilis Serology,  Lab - Chlamydia/GC NAA, Confirmation - Gonococcus culture    Total time spent: 30 minutes   Return if symptoms worsen or fail to improve.  Glenna Fellows, FNP

## 2022-10-16 NOTE — Progress Notes (Signed)
Pt appointment for routine STI screening. Seen by FNP White.

## 2022-10-23 LAB — CHLAMYDIA/GC NAA, CONFIRMATION
Chlamydia trachomatis, NAA: NEGATIVE
Neisseria gonorrhoeae, NAA: NEGATIVE

## 2022-10-23 LAB — GONOCOCCUS CULTURE

## 2022-11-04 ENCOUNTER — Ambulatory Visit
Admission: RE | Admit: 2022-11-04 | Discharge: 2022-11-04 | Disposition: A | Discharge status: Home / Self Care | Payer: Managed Care, Other (non HMO) | Source: Ambulatory Visit | Attending: Urgent Care | Admitting: Urgent Care

## 2022-11-04 VITALS — BP 145/87 | HR 84 | Temp 98.1°F | Resp 16

## 2022-11-04 DIAGNOSIS — Z7251 High risk heterosexual behavior: Secondary | ICD-10-CM | POA: Insufficient documentation

## 2022-11-04 DIAGNOSIS — N452 Orchitis: Secondary | ICD-10-CM | POA: Insufficient documentation

## 2022-11-04 MED ORDER — DOXYCYCLINE HYCLATE 100 MG PO CAPS
100.0000 mg | ORAL_CAPSULE | Freq: Two times a day (BID) | ORAL | 0 refills | Status: AC
Start: 2022-11-04 — End: ?

## 2022-11-04 MED ORDER — CEFTRIAXONE SODIUM 500 MG IJ SOLR
500.0000 mg | INTRAMUSCULAR | Status: DC
  Administered 2022-11-04: 500 mg via INTRAMUSCULAR

## 2022-11-04 NOTE — Discharge Instructions (Signed)
Avoid all forms of sexual intercourse (oral, vaginal, anal) for the next 7 days to avoid spreading/reinfecting or at least until we can see what kinds of infection results are positive.  Abstaining for 2 weeks would be better but at least 1 week is required.  We will let you know about your test results from the swab we did today and if you need any prescriptions for antibiotics or changes to your treatment from today.  

## 2022-11-04 NOTE — ED Provider Notes (Signed)
[facility]  Note:  This document was prepared using Conservation officer, historic buildings and may include unintentional dictation errors.  MRN: [id number] DOB: October 22, 1976  Subjective:   Douglas Terrell is a 46 y.o. male presenting for 3-day history of acute onset persistent right-sided testicle over the underside.  Patient did have sex with a male partner 5 days ago, unfortunately the condom broke.  Denies dysuria, hematuria, urinary frequency, penile discharge, penile swelling, testicular swelling, anal pain, groin pain.   No current facility-administered medications for this encounter.  Current Outpatient Medications:    levothyroxine (SYNTHROID) 100 MCG tablet, Take 90 mcg by mouth daily before breakfast., Disp: , Rfl:    testosterone cypionate (DEPOTESTOTERONE CYPIONATE) 100 MG/ML injection, Inject 200 mg into the muscle daily. For IM use only, Disp: , Rfl:    No Known Allergies  History reviewed. No pertinent past medical history.   History reviewed. No pertinent surgical history.  History reviewed. No pertinent family history.  Social History   Tobacco Use   Smoking status: Never   Smokeless tobacco: Never  Vaping Use   Vaping Use: Never used  Substance Use Topics   Alcohol use: Yes    Alcohol/week: 2.0 standard drinks of alcohol    Types: 2 Cans of beer per week    Comment: occ   Drug use: Never    ROS   Objective:   Vitals: BP (!) 145/87 (BP Location: Right Arm)   Pulse 84   Temp 98.1 F (36.7 C) (Oral)   Resp 16   SpO2 95%   Physical Exam Constitutional:      General: He is not in acute distress.    Appearance: Normal appearance. He is well-developed and normal weight. He is not ill-appearing, toxic-appearing or diaphoretic.  HENT:     Head: Normocephalic and atraumatic.     Right Ear: External ear normal.     Left Ear: External ear normal.     Nose: Nose normal.     Mouth/Throat:     Pharynx: Oropharynx is clear.  Eyes:      General: No scleral icterus.       Right eye: No discharge.        Left eye: No discharge.     Extraocular Movements: Extraocular movements intact.  Cardiovascular:     Rate and Rhythm: Normal rate.  Pulmonary:     Effort: Pulmonary effort is normal.  Genitourinary:    Penis: Circumcised. No phimosis, paraphimosis, hypospadias, erythema, tenderness, discharge, swelling or lesions.      Testes:        Right: Tenderness (inferior most portion) present. Mass, swelling, testicular hydrocele or varicocele not present. Right testis is descended. Cremasteric reflex is present.         Left: Mass, tenderness, swelling, testicular hydrocele or varicocele not present. Left testis is descended. Cremasteric reflex is present.      Epididymis:     Right: Not inflamed or enlarged. No mass or tenderness.     Left: Not inflamed or enlarged. No mass or tenderness.  Musculoskeletal:     Cervical back: Normal range of motion.  Neurological:     Mental Status: He is alert and oriented to person, place, and time.  Psychiatric:        Mood and Affect: Mood normal.        Behavior: Behavior normal.        Thought Content: Thought content normal.  Judgment: Judgment normal.     Assessment and Plan :   PDMP not reviewed this encounter.  1. Orchitis   2. Unprotected sex     Patient treated empirically as per CDC guidelines with IM ceftriaxone, doxycycline as an outpatient.  Labs pending.   Counseled on safe sex practices including abstaining for 1 week following treatment.  Counseled patient on potential for adverse effects with medications prescribed/recommended today, ER and return-to-clinic precautions discussed, patient verbalized understanding.    Wallis Bamberg, [place] 11/04/22 [id number]

## 2022-11-04 NOTE — ED Triage Notes (Signed)
Pt c/o right testicle pain x 3 days-NAD-steady gait

## 2022-11-06 LAB — CYTOLOGY, (ORAL, ANAL, URETHRAL) ANCILLARY ONLY
Chlamydia: NEGATIVE
Comment: NEGATIVE
Comment: NEGATIVE
Comment: NORMAL
Neisseria Gonorrhea: NEGATIVE
Trichomonas: NEGATIVE

## 2022-11-19 ENCOUNTER — Ambulatory Visit: Payer: Self-pay

## 2022-12-13 NOTE — Addendum Note (Signed)
Addended by: Kassady Laboy on: 12/13/2022 03:20 PM   Modules accepted: Orders  

## 2023-05-26 ENCOUNTER — Ambulatory Visit: Payer: Managed Care, Other (non HMO)

## 2023-05-26 DIAGNOSIS — K5730 Diverticulosis of large intestine without perforation or abscess without bleeding: Secondary | ICD-10-CM

## 2023-05-26 DIAGNOSIS — Z1211 Encounter for screening for malignant neoplasm of colon: Secondary | ICD-10-CM

## 2023-05-26 DIAGNOSIS — K640 First degree hemorrhoids: Secondary | ICD-10-CM
